# Patient Record
Sex: Male | Born: 1980 | Race: White | Hispanic: Yes | Marital: Single | Smoking: Never smoker
Health system: Southern US, Community
[De-identification: ages and names within clinical notes are randomized; demographics above are authoritative.]

## PROBLEM LIST (undated history)

## (undated) DIAGNOSIS — E119 Type 2 diabetes mellitus without complications: Secondary | ICD-10-CM

## (undated) HISTORY — DX: Type 2 diabetes mellitus without complications: E11.9

---

## 2004-05-04 ENCOUNTER — Emergency Department (HOSPITAL_COMMUNITY): Admission: EM | Admit: 2004-05-04 | Discharge: 2004-05-04 | Payer: Self-pay | Admitting: Emergency Medicine

## 2005-05-03 ENCOUNTER — Ambulatory Visit (HOSPITAL_COMMUNITY): Admission: RE | Admit: 2005-05-03 | Discharge: 2005-05-03 | Payer: Self-pay | Admitting: Emergency Medicine

## 2005-05-03 ENCOUNTER — Emergency Department (HOSPITAL_COMMUNITY): Admission: EM | Admit: 2005-05-03 | Discharge: 2005-05-03 | Payer: Self-pay | Admitting: Family Medicine

## 2007-07-13 ENCOUNTER — Emergency Department (HOSPITAL_COMMUNITY): Admission: EM | Admit: 2007-07-13 | Discharge: 2007-07-13 | Payer: Self-pay | Admitting: Emergency Medicine
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
## 2013-10-27 ENCOUNTER — Ambulatory Visit: Payer: No Typology Code available for payment source | Attending: Internal Medicine

## 2013-12-21 ENCOUNTER — Ambulatory Visit: Payer: No Typology Code available for payment source | Attending: Internal Medicine | Admitting: Internal Medicine

## 2013-12-21 ENCOUNTER — Encounter: Payer: Self-pay | Admitting: Internal Medicine

## 2013-12-21 VITALS — BP 119/80 | HR 75 | Temp 98.0°F | Resp 16 | Wt 203.0 lb

## 2013-12-21 DIAGNOSIS — E785 Hyperlipidemia, unspecified: Secondary | ICD-10-CM

## 2013-12-21 DIAGNOSIS — Z91199 Patient's noncompliance with other medical treatment and regimen due to unspecified reason: Secondary | ICD-10-CM | POA: Insufficient documentation

## 2013-12-21 DIAGNOSIS — Z9119 Patient's noncompliance with other medical treatment and regimen: Secondary | ICD-10-CM | POA: Insufficient documentation

## 2013-12-21 DIAGNOSIS — Z139 Encounter for screening, unspecified: Secondary | ICD-10-CM

## 2013-12-21 DIAGNOSIS — E119 Type 2 diabetes mellitus without complications: Secondary | ICD-10-CM | POA: Insufficient documentation

## 2013-12-21 DIAGNOSIS — Z833 Family history of diabetes mellitus: Secondary | ICD-10-CM | POA: Insufficient documentation

## 2013-12-21 DIAGNOSIS — Z130 Encounter for screening for diseases of the blood and blood-forming organs and certain disorders involving the immune mechanism: Secondary | ICD-10-CM | POA: Insufficient documentation

## 2013-12-21 LAB — COMPLETE METABOLIC PANEL WITH GFR
ALT: 33 U/L (ref 0–53)
AST: 17 U/L (ref 0–37)
Albumin: 4.4 g/dL (ref 3.5–5.2)
Alkaline Phosphatase: 88 U/L (ref 39–117)
BUN: 14 mg/dL (ref 6–23)
CO2: 26 mEq/L (ref 19–32)
Calcium: 9.2 mg/dL (ref 8.4–10.5)
Chloride: 99 mEq/L (ref 96–112)
Creat: 0.64 mg/dL (ref 0.50–1.35)
GFR, Est African American: >89 mL/min
GFR, Est Non African American: >89 mL/min
Glucose, Bld: 290 mg/dL — ABNORMAL HIGH (ref 70–99)
Potassium: 4.3 mEq/L (ref 3.5–5.3)
Sodium: 136 mEq/L (ref 135–145)
Total Bilirubin: 0.8 mg/dL (ref 0.2–1.2)
Total Protein: 7 g/dL (ref 6.0–8.3)

## 2013-12-21 LAB — CBC WITH DIFFERENTIAL/PLATELET
Basophils Absolute: 0 10*3/uL (ref 0.0–0.1)
Basophils Relative: 0 % (ref 0–1)
Eosinophils Absolute: 0.1 10*3/uL (ref 0.0–0.7)
Eosinophils Relative: 2 % (ref 0–5)
HCT: 45.4 % (ref 39.0–52.0)
Hemoglobin: 15 g/dL (ref 13.0–17.0)
Lymphocytes Relative: 37 % (ref 12–46)
Lymphs Abs: 2.1 10*3/uL (ref 0.7–4.0)
MCH: 25.6 pg — ABNORMAL LOW (ref 26.0–34.0)
MCHC: 33 g/dL (ref 30.0–36.0)
MCV: 77.5 fL — ABNORMAL LOW (ref 78.0–100.0)
Monocytes Absolute: 0.3 10*3/uL (ref 0.1–1.0)
Monocytes Relative: 6 % (ref 3–12)
Neutro Abs: 3.1 10*3/uL (ref 1.7–7.7)
Neutrophils Relative %: 55 % (ref 43–77)
Platelets: 267 10*3/uL (ref 150–400)
RBC: 5.86 MIL/uL — ABNORMAL HIGH (ref 4.22–5.81)
RDW: 13.6 % (ref 11.5–15.5)
WBC: 5.6 10*3/uL (ref 4.0–10.5)

## 2013-12-21 LAB — LIPID PANEL
Cholesterol: 251 mg/dL — ABNORMAL HIGH (ref 0–200)
HDL: 42 mg/dL (ref >39)
LDL Cholesterol: 140 mg/dL — ABNORMAL HIGH (ref 0–99)
Total CHOL/HDL Ratio: 6 Ratio
Triglycerides: 346 mg/dL — ABNORMAL HIGH (ref <150)
VLDL: 69 mg/dL — ABNORMAL HIGH (ref 0–40)

## 2013-12-21 LAB — POCT GLYCOSYLATED HEMOGLOBIN (HGB A1C): Hemoglobin A1C: 12.9

## 2013-12-21 LAB — GLUCOSE, POCT (MANUAL RESULT ENTRY): POC Glucose: 280 mg/dl — AB (ref 70–99)

## 2013-12-21 LAB — TSH: TSH: 0.726 u[IU]/mL (ref 0.350–4.500)

## 2013-12-21 MED ORDER — METFORMIN HCL 500 MG PO TABS: 500.0000 mg | ORAL_TABLET | Freq: Two times a day (BID) | ORAL | Status: DC

## 2013-12-21 MED ORDER — INSULIN ASPART 100 UNIT/ML ~~LOC~~ SOLN
10.0000 [IU] | Freq: Once | SUBCUTANEOUS | Status: AC
  Administered 2013-12-21: 10 [IU] via SUBCUTANEOUS

## 2013-12-21 MED ORDER — PRAVASTATIN SODIUM 40 MG PO TABS: 40.0000 mg | ORAL_TABLET | Freq: Every day | ORAL | Status: DC

## 2013-12-21 MED ORDER — GLIMEPIRIDE 1 MG PO TABS: 1.0000 mg | ORAL_TABLET | Freq: Every day | ORAL | Status: DC

## 2013-12-21 NOTE — Progress Notes (Signed)
Patient Demographics  Arthur Griffith, is a 33 y.o. male  OMA:[id number]  FSE:[id number]  DOB - 10/28/1980  CC:  Chief Complaint  Patient presents with  . Establish Care       HPI: Arthur Griffith is a 33 y.o. male here today to establish medical care. Patient has history of diabetes, hyperlipidemia as per patient he was taking metformin 500 mg 3 times a day and Amaryl 1 mg daily as per patient his fasting sugar used to be around 120 to 130 milligrams a deciliter, for the last 2 months patient has not been taking his medication, denies any headache dizziness blurry vision polyuria polydipsia, today his blood sugar was 2 80 mg/dL, was given 10 units of insulin.  Patient has No headache, No chest pain, No abdominal pain - No Nausea, No new weakness tingling or numbness, No Cough - SOB.  No Known Allergies Past Medical History  Diagnosis Date  . Diabetes mellitus without complication    No current outpatient prescriptions on file prior to visit.   No current facility-administered medications on file prior to visit.   Family History  Problem Relation Age of Onset  . Diabetes Mother   . Diabetes Father   . Diabetes Maternal Grandmother   . Diabetes Maternal Grandfather   . Diabetes Paternal Grandmother   . Diabetes Paternal Grandfather    History   Social History  . Marital Status: Single    Spouse Name: N/A    Number of Children: N/A  . Years of Education: N/A   Occupational History  . Not on file.   Social History Main Topics  . Smoking status: Never Smoker   . Smokeless tobacco: Not on file  . Alcohol Use: Yes  . Drug Use: Not on file  . Sexual Activity: Not on file   Other Topics Concern  . Not on file   Social History Narrative  . No narrative on file    Review of Systems: Constitutional: Negative for fever, chills, diaphoresis, activity change, appetite change and fatigue. HENT: Negative for ear pain, nosebleeds, congestion, facial swelling,  rhinorrhea, neck pain, neck stiffness and ear discharge.  Eyes: Negative for pain, discharge, redness, itching and visual disturbance. Respiratory: Negative for cough, choking, chest tightness, shortness of breath, wheezing and stridor.  Cardiovascular: Negative for chest pain, palpitations and leg swelling. Gastrointestinal: Negative for abdominal distention. Genitourinary: Negative for dysuria, urgency, frequency, hematuria, flank pain, decreased urine volume, difficulty urinating and dyspareunia.  Musculoskeletal: Negative for back pain, joint swelling, arthralgia and gait problem. Neurological: Negative for dizziness, tremors, seizures, syncope, facial asymmetry, speech difficulty, weakness, light-headedness, numbness and headaches.  Hematological: Negative for adenopathy. Does not bruise/bleed easily. Psychiatric/Behavioral: Negative for hallucinations, behavioral problems, confusion, dysphoric mood, decreased concentration and agitation.    Objective:   Filed Vitals:   12/21/13 0942  BP: 119/80  Pulse: 75  Temp: 98 F (36.7 C)  Resp: 16    Physical Exam: Constitutional: Patient appears well-developed and well-nourished. No distress. HENT: Normocephalic, atraumatic, External right and left ear normal. Oropharynx is clear and moist.  Eyes: Conjunctivae and EOM are normal. PERRLA, no scleral icterus. Neck: Normal ROM. Neck supple. No JVD. No tracheal deviation. No thyromegaly. CVS: RRR, S1/S2 +, no murmurs, no gallops, no carotid bruit.  Pulmonary: Effort and breath sounds normal, no stridor, rhonchi, wheezes, rales.  Abdominal: Soft. BS +, no distension, tenderness, rebound or guarding.  Musculoskeletal: Normal range of motion. No edema and no tenderness.  Neuro:  Alert. Normal reflexes, muscle tone coordination. No cranial nerve deficit. Skin: Skin is warm and dry. No rash noted. Not diaphoretic. No erythema. No pallor. Psychiatric: Normal mood and affect. Behavior, judgment,  thought content normal.  No results found for this basename: WBC, HGB, HCT, MCV, PLT   No results found for this basename: CREATININE, BUN, NA, K, CL, CO2    Lab Results  Component Value Date   HGBA1C 12.9 12/21/2013   Lipid Panel  No results found for this basename: chol, trig, hdl, cholhdl, vldl, ldlcalc       Assessment and plan:   1. DM (diabetes mellitus) Results for orders placed in visit on 12/21/13  GLUCOSE, POCT (MANUAL RESULT ENTRY)      Result Value Ref Range   POC Glucose 280 (*) 70 - 99 mg/dl  POCT GLYCOSYLATED HEMOGLOBIN (HGB A1C)      Result Value Ref Range   Hemoglobin A1C 12.9     Uncontrolled, patient has not been taking the medication for the last 2 months, I have resumed back on metformin and Amaryl, advise patient for diabetes meal planning also keep the fingerstick log. The repeat A1c in 3 months  - insulin aspart (novoLOG) injection 10 Units; Inject 0.1 mLs (10 Units total) into the skin once. - metFORMIN (GLUCOPHAGE) 500 MG tablet; Take 1 tablet (500 mg total) by mouth 2 (two) times daily with a meal.  Dispense: 60 tablet; Refill: 3 - glimepiride (AMARYL) 1 MG tablet; Take 1 tablet (1 mg total) by mouth daily with breakfast.  Dispense: 30 tablet; Refill: 2  2. Other and unspecified hyperlipidemia Resume back on Pravachol, will check lipid panel. - pravastatin (PRAVACHOL) 40 MG tablet; Take 1 tablet (40 mg total) by mouth daily.  Dispense: 30 tablet; Refill: 2  3. Screening Baseline blood work - CBC with Differential - COMPLETE METABOLIC PANEL WITH GFR - TSH - Lipid panel - Vit D  25 hydroxy (rtn osteoporosis monitoring)   Return in about 3 months (around 03/23/2014) for diabetes, hyperipidemia.   Doris Cheadleeepak Sidonia Nutter, MD

## 2013-12-21 NOTE — Progress Notes (Signed)
Patient here to establish care Has history of DM Has not had any of his medications in over a month

## 2013-12-21 NOTE — Patient Instructions (Signed)

## 2013-12-22 ENCOUNTER — Telehealth: Payer: Self-pay

## 2013-12-22 DIAGNOSIS — E785 Hyperlipidemia, unspecified: Secondary | ICD-10-CM

## 2013-12-22 LAB — VITAMIN D 25 HYDROXY (VIT D DEFICIENCY, FRACTURES): Vit D, 25-Hydroxy: 37 ng/mL (ref 30–89)

## 2013-12-22 MED ORDER — FENOFIBRATE 145 MG PO TABS: 145.0000 mg | ORAL_TABLET | Freq: Every day | ORAL | Status: DC

## 2013-12-22 NOTE — Telephone Encounter (Signed)
Placed call to patient's home and mobile number. Home number invalid and mobile number not in service. Call placed using [employer]pre[employer] service.

## 2013-12-22 NOTE — Telephone Encounter (Signed)
Fenofibrate 145 mg daily ordered and sent to pharmacy on file.

## 2013-12-22 NOTE — Telephone Encounter (Signed)
Message copied by Marykay Lex on Wed Dec 22, 2013  3:57 PM ------      Message from: Doris Cheadle      Created: Wed Dec 22, 2013 10:01 AM       Blood work reviewed, noticed elevated triglycerides, advise patient for low fat diet. And start taking fenofibrate 145 mg daily.      Advise patient to take his diabetes medications regularly.       ------

## 2014-03-22 ENCOUNTER — Emergency Department (HOSPITAL_COMMUNITY): Payer: No Typology Code available for payment source

## 2014-03-22 ENCOUNTER — Encounter (HOSPITAL_COMMUNITY): Payer: Self-pay | Admitting: Emergency Medicine

## 2014-03-22 ENCOUNTER — Emergency Department (HOSPITAL_COMMUNITY)
Admission: EM | Admit: 2014-03-22 | Discharge: 2014-03-22 | Disposition: A | Discharge status: Home / Self Care | Payer: No Typology Code available for payment source | Attending: Emergency Medicine | Admitting: Emergency Medicine

## 2014-03-22 DIAGNOSIS — E119 Type 2 diabetes mellitus without complications: Secondary | ICD-10-CM | POA: Insufficient documentation

## 2014-03-22 DIAGNOSIS — R079 Chest pain, unspecified: Secondary | ICD-10-CM | POA: Insufficient documentation

## 2014-03-22 DIAGNOSIS — Z79899 Other long term (current) drug therapy: Secondary | ICD-10-CM | POA: Insufficient documentation

## 2014-03-22 DIAGNOSIS — R42 Dizziness and giddiness: Secondary | ICD-10-CM | POA: Insufficient documentation

## 2014-03-22 DIAGNOSIS — R0602 Shortness of breath: Secondary | ICD-10-CM | POA: Insufficient documentation

## 2014-03-22 DIAGNOSIS — R0789 Other chest pain: Secondary | ICD-10-CM

## 2014-03-22 LAB — HEPATIC FUNCTION PANEL
ALK PHOS: 95 U/L (ref 39–117)
ALT: 42 U/L (ref 0–53)
AST: 32 U/L (ref 0–37)
Albumin: 3.9 g/dL (ref 3.5–5.2)
BILIRUBIN TOTAL: 0.6 mg/dL (ref 0.3–1.2)
Bilirubin, Direct: <0.2 mg/dL (ref 0.0–0.3)
TOTAL PROTEIN: 7.4 g/dL (ref 6.0–8.3)

## 2014-03-22 LAB — CBC
HCT: 45.1 % (ref 39.0–52.0)
Hemoglobin: 14.9 g/dL (ref 13.0–17.0)
MCH: 26.1 pg (ref 26.0–34.0)
MCHC: 33 g/dL (ref 30.0–36.0)
MCV: 79 fL (ref 78.0–100.0)
Platelets: 251 10*3/uL (ref 150–400)
RBC: 5.71 MIL/uL (ref 4.22–5.81)
RDW: 13.2 % (ref 11.5–15.5)
WBC: 5.3 10*3/uL (ref 4.0–10.5)

## 2014-03-22 LAB — BASIC METABOLIC PANEL
Anion gap: 14 (ref 5–15)
BUN: 11 mg/dL (ref 6–23)
CO2: 23 mEq/L (ref 19–32)
Calcium: 8.9 mg/dL (ref 8.4–10.5)
Chloride: 99 mEq/L (ref 96–112)
Creatinine, Ser: 0.47 mg/dL — ABNORMAL LOW (ref 0.50–1.35)
GFR calc Af Amer: >90 mL/min (ref >90)
GFR calc non Af Amer: >90 mL/min (ref >90)
Glucose, Bld: 289 mg/dL — ABNORMAL HIGH (ref 70–99)
Potassium: 4.1 mEq/L (ref 3.7–5.3)
Sodium: 136 mEq/L — ABNORMAL LOW (ref 137–147)

## 2014-03-22 LAB — LIPASE, BLOOD: Lipase: 28 U/L (ref 11–59)

## 2014-03-22 LAB — TROPONIN I

## 2014-03-22 LAB — PRO B NATRIURETIC PEPTIDE: Pro B Natriuretic peptide (BNP): <5 pg/mL (ref 0–125)

## 2014-03-22 MED ORDER — NAPROXEN 500 MG PO TABS: 500.0000 mg | ORAL_TABLET | Freq: Two times a day (BID) | ORAL | Status: DC

## 2014-03-22 MED ORDER — KETOROLAC TROMETHAMINE 30 MG/ML IJ SOLN
30.0000 mg | Freq: Once | INTRAMUSCULAR | Status: AC
  Administered 2014-03-22: 30 mg via INTRAVENOUS
  Filled 2014-03-22: qty 1

## 2014-03-22 MED ORDER — ASPIRIN 81 MG PO CHEW
324.0000 mg | CHEWABLE_TABLET | Freq: Once | ORAL | Status: AC
  Administered 2014-03-22: 324 mg via ORAL
  Filled 2014-03-22: qty 4

## 2014-03-22 MED ORDER — SODIUM CHLORIDE 0.9 % IV BOLUS (SEPSIS)
1000.0000 mL | Freq: Once | INTRAVENOUS | Status: AC
  Administered 2014-03-22: 1000 mL via INTRAVENOUS

## 2014-03-22 MED ORDER — GI COCKTAIL ~~LOC~~
30.0000 mL | Freq: Once | ORAL | Status: AC
  Administered 2014-03-22: 30 mL via ORAL
  Filled 2014-03-22: qty 30

## 2014-03-22 NOTE — Discharge Instructions (Signed)
Please follow up with your primary care physician in 1-2 days. If you do not have one please call the [facility] number listed above. Please read all discharge instructions and return precautions.    Dolor de pecho (no especfico) (Chest Pain (Nonspecific)) Con frecuencia es difcil dar un diagnstico especfico de la causa del dolor de [place]. Siempre hay una posibilidad de que el dolor podra estar relacionado con algo grave, como un ataque al corazn o un cogulo sanguneo en los pulmones. Debe someterse a controles con el mdico para ms evaluaciones. CAUSAS   Acidez.  Neumona o bronquitis.  Ansiedad o estrs.  Inflamacin de la zona que rodea al corazn (pericarditis) o a los pulmones (pleuritis o pleuresa).  Un cogulo sanguneo en el pulmn.  Colapso de un pulmn (neumotrax), que puede aparecer de [employer] repentina por s solo (neumotrax espontneo) o debido a un traumatismo en el trax.  Culebrilla (virus del herpes zster). La pared torcica est compuesta por huesos, msculos y TEFL teacher. Cualquiera de estos puede ser la fuente del dolor.  Puede haber una contusin en los huesos debido a una lesin.  Puede haber un esguince en los msculos o el cartlago ocasionado por la tos o por Rea.  El cartlago puede verse afectado por una inflamacin y Psychologist, counselling (costocondritis). DIAGNSTICO  Gretchen Short se necesiten anlisis de laboratorio u otros estudios para Veterinary surgeon causa del Engineer, mining. Adems, puede indicarle que se haga una prueba llamada electrocadiograma (ECG) ambulatorio. El ECG registra los patrones de los latidos cardacos durante 24horas. Adems, pueden hacerle otros estudios, por ejemplo:  Ecocardiograma transtorcico (ETT). Durante Management consultant, se usan ondas sonoras para evaluar el flujo de la sangre a travs del corazn.  Ecocardiograma transesofgico (ETE).  Monitoreo cardaco. Permite que el mdico controle la frecuencia  y el ritmo cardaco en tiempo real.  Monitor Holter. Es un dispositivo porttil que [employer] latidos cardacos y [place] a Education administrator las arritmias cardacas. Le permite al [employer] registrar la actividad cardaca durante varios das, si es necesario.  Pruebas de estrs por ejercicio o por medicamentos que aceleran los latidos cardacos. TRATAMIENTO   El tratamiento depende de la causa del dolor de [place]. El tratamiento puede incluir:  Inhibidores de la acidez estomacal.  Antiinflamatorios.  Analgsicos para las enfermedades inflamatorias.  Antibiticos, si hay una infeccin.  Podrn aconsejarle que modifique su estilo de vida. Esto incluye dejar de fumar y evitar el alcohol, la cafena y el chocolate.  Pueden aconsejarle que mantenga la cabeza levantada (elevada) cuando duerme. Esto reduce la probabilidad de que el cido retroceda del estmago al esfago. En la [employer], el dolor de pecho no especfico mejorar en el trmino de 2 a 3das, con reposo y [employer].  INSTRUCCIONES PARA EL CUIDADO EN EL HOGAR   Si le prescriben antibiticos, tmelos tal como se le indic. Termnelos aunque comience a sentirse mejor.  [address], no haga actividades fsicas que provoquen dolor de [place]. Contine con las actividades fsicas tal como se le indic  No consuma ningn producto que contenga tabaco, incluidos cigarrillos, tabaco de Theatre manager o cigarrillos electrnicos.  Evite el consumo de alcohol.  Tome los medicamentos solamente como se lo haya indicado el mdico.  Siga las sugerencias del mdico en lo que respecta a las pruebas adicionales, si el dolor de pecho no desaparece.  Concurra a todas las visitas de control programadas. Si no lo hace, podra desarrollar problemas permanentes (crnicos) relacionados con  el dolor. Si hay algn problema para concurrir a una cita, llame para reprogramarla. SOLICITE ATENCIN MDICA SI:   El dolor de pecho no  desaparece, incluso despus del tratamiento.  Tiene una erupcin cutnea con ampollas en el pecho.  Tiene fiebre. SOLICITE ATENCIN MDICA DE [employer] SI:   Aumenta el dolor de pecho o este se irradia hacia el brazo, el cuello, la [place], la espalda o el abdomen.  Le falta el aire.  La tos empeora, o expectora sangre.  Siente dolor intenso en la espalda o el abdomen.  Se siente nauseoso o vomita.  Siente debilidad intensa.  Se desmaya.  Tiene escalofros. Esto es Radio broadcast assistant. No espere a ver si el dolor se pasa. Obtenga ayuda mdica de inmediato. Llame a los servicios de emergencia locales (911 en los [place]). No conduzca por sus propios medios [employer] hospital. ASEGRESE DE QUE:   Comprende estas instrucciones.  Controlar su afeccin.  Recibir ayuda de inmediato si no mejora o si empeora. Document Released: 07/15/2005 Document Revised: 07/20/2013 [facility] Patient Information 2015 [place], [place]. This information is not intended to replace advice given to you by your health care provider. Make sure you discuss any questions you have with your health care provider.

## 2014-03-22 NOTE — ED Notes (Signed)
PA at bedside.

## 2014-03-22 NOTE — ED Provider Notes (Signed)
Medical screening examination/treatment/procedure(s) were performed by non-physician practitioner and as supervising physician I was immediately available for consultation/collaboration.     Geoffery Lyons, MD 03/22/14 [phone]

## 2014-03-22 NOTE — ED Provider Notes (Signed)
CSN: [id number]     Arrival date & time 03/22/14  0914 History   First MD Initiated Contact with Patient 03/22/14 [phone]     Chief Complaint  Patient presents with  . Chest Pain  . Shortness of Breath     (Consider location/radiation/quality/duration/timing/severity/associated sxs/prior Treatment) HPI Comments: Patient is a 33 year old male past medical history significant for DM, HLD presenting to the emergency department for 2 weeks of central chest pain associated shortness of breath. Pain has been constant. No alleviating or aggravating factors. No medications taken PTA. No history of similar chest pain. Denies any associated fevers, chills, nausea, vomiting, abdominal pain, leg swelling. PERC negative. No familial early cardiac history.   Patient is a 33 y.o. male presenting with chest pain and shortness of breath.  Chest Pain Associated symptoms: shortness of breath   Associated symptoms: no fever and no headache   Shortness of Breath Associated symptoms: chest pain   Associated symptoms: no fever and no headaches     Past Medical History  Diagnosis Date  . Diabetes mellitus without complication    History reviewed. No pertinent past surgical history. Family History  Problem Relation Age of Onset  . Diabetes Mother   . Diabetes Father   . Diabetes Maternal Grandmother   . Diabetes Maternal Grandfather   . Diabetes Paternal Grandmother   . Diabetes Paternal Grandfather    History  Substance Use Topics  . Smoking status: Never Smoker   . Smokeless tobacco: Not on file  . Alcohol Use: Yes    Review of Systems  Constitutional: Negative for fever and chills.  Respiratory: Positive for shortness of breath.   Cardiovascular: Positive for chest pain.  Neurological: Positive for light-headedness. Negative for syncope and headaches.  All other systems reviewed and are negative.     Allergies  Review of patient's allergies indicates no known allergies.  Home  Medications   Prior to Admission medications   Medication Sig Start Date End Date Taking? Authorizing Provider  glimepiride (AMARYL) 1 MG tablet Take 1 tablet (1 mg total) by mouth daily with breakfast. 12/21/13  Yes Doris Cheadle, MD  metFORMIN (GLUCOPHAGE) 500 MG tablet Take 1 tablet (500 mg total) by mouth 2 (two) times daily with a meal. 12/21/13  Yes Deepak Advani, MD  pravastatin (PRAVACHOL) 40 MG tablet Take 1 tablet (40 mg total) by mouth daily. 12/21/13  Yes Doris Cheadle, MD  naproxen (NAPROSYN) 500 MG tablet Take 1 tablet (500 mg total) by mouth 2 (two) times daily with a meal. 03/22/14   Freddye Cardamone L Zander Ingham, PA-C   BP 101/61  Pulse 62  Temp(Src) 98.1 F (36.7 C) (Oral)  Resp 14  SpO2 98% Physical Exam  Nursing note and vitals reviewed. Constitutional: He is oriented to person, place, and time. He appears well-developed and well-nourished. No distress.  HENT:  Head: Normocephalic and atraumatic.  Right Ear: External ear normal.  Left Ear: External ear normal.  Nose: Nose normal.  Mouth/Throat: Oropharynx is clear and moist. No oropharyngeal exudate.  Eyes: Conjunctivae and EOM are normal. Pupils are equal, round, and reactive to light.  Neck: Normal range of motion. Neck supple.  Cardiovascular: Normal rate, regular rhythm, normal heart sounds and intact distal pulses.   Pulmonary/Chest: Effort normal and breath sounds normal. No respiratory distress.  Abdominal: Soft. There is no tenderness.  Musculoskeletal: He exhibits no edema.  Neurological: He is alert and oriented to person, place, and time. He has normal strength. No cranial nerve deficit.  Gait normal. GCS eye subscore is 4. GCS verbal subscore is 5. GCS motor subscore is 6.  Sensation grossly intact.  No pronator drift.  Bilateral heel-knee-shin intact.  Skin: Skin is warm and dry. He is not diaphoretic.    ED Course  Procedures (including critical care time) Medications  aspirin chewable tablet 324 mg (324  mg Oral Given 03/22/14 1056)  gi cocktail (Maalox,Lidocaine,Donnatal) (30 mLs Oral Given 03/22/14 1056)  sodium chloride 0.9 % bolus 1,000 mL (0 mLs Intravenous Stopped 03/22/14 1242)  ketorolac (TORADOL) 30 MG/ML injection 30 mg (30 mg Intravenous Given 03/22/14 1148)    Labs Review Labs Reviewed  BASIC METABOLIC PANEL - Abnormal; Notable for the following:    Sodium 136 (*)    Glucose, Bld 289 (*)    Creatinine, Ser 0.47 (*)    All other components within normal limits  CBC  PRO B NATRIURETIC PEPTIDE  TROPONIN I  HEPATIC FUNCTION PANEL  LIPASE, BLOOD  I-STAT TROPOININ, ED    Imaging Review Dg Chest 2 View  03/22/2014   CLINICAL DATA:  Chest pain, shortness of breath  EXAM: CHEST  2 VIEW  COMPARISON:  None.  FINDINGS: Lungs are clear.  No pleural effusion or pneumothorax.  The heart is normal in size.  Visualized osseous structures are within normal limits.  IMPRESSION: No evidence of acute cardiopulmonary disease.   Electronically Signed   By: Charline Bills M.D.   On: 03/22/2014 10:03     EKG Interpretation None      MDM   Final diagnoses:  Chest pain, atypical    Filed Vitals:   03/22/14 1230  BP: 101/61  Pulse: 62  Temp:   Resp: 14   Afebrile, NAD, non-toxic appearing, AAOx4.  Patient is to be discharged with recommendation to follow up with PCP in regards to today's hospital visit. Chest pain is not likely of cardiac or pulmonary etiology d/t presentation, perc negative, VSS, no tracheal deviation, no JVD or new murmur, RRR, breath sounds equal bilaterally, EKG without acute abnormalities, negative troponin, and negative CXR. Pt has been advised to return to the ED is CP becomes exertional, associated with diaphoresis or nausea, radiates to left jaw/arm, worsens or becomes concerning in any way. Pt appears reliable for follow up and is agreeable to discharge.   Case has been discussed with Dr. Judd Lien who agrees with the above plan to discharge.      Jeannetta Ellis, PA-C 03/22/14 1253

## 2014-03-22 NOTE — ED Notes (Signed)
Pt reports mid chest pains and tightness x 2 weeks with sob. ekg done at triage and airway intact.

## 2014-04-11 ENCOUNTER — Encounter: Payer: Self-pay | Admitting: Internal Medicine

## 2014-04-11 ENCOUNTER — Ambulatory Visit: Payer: No Typology Code available for payment source | Attending: Internal Medicine | Admitting: Internal Medicine

## 2014-04-11 VITALS — BP 121/80 | HR 98 | Temp 98.2°F | Resp 16 | Wt 204.0 lb

## 2014-04-11 DIAGNOSIS — E139 Other specified diabetes mellitus without complications: Secondary | ICD-10-CM

## 2014-04-11 DIAGNOSIS — E781 Pure hyperglyceridemia: Secondary | ICD-10-CM

## 2014-04-11 DIAGNOSIS — E119 Type 2 diabetes mellitus without complications: Secondary | ICD-10-CM | POA: Insufficient documentation

## 2014-04-11 DIAGNOSIS — Z791 Long term (current) use of non-steroidal anti-inflammatories (NSAID): Secondary | ICD-10-CM | POA: Insufficient documentation

## 2014-04-11 DIAGNOSIS — E089 Diabetes mellitus due to underlying condition without complications: Secondary | ICD-10-CM

## 2014-04-11 DIAGNOSIS — E785 Hyperlipidemia, unspecified: Secondary | ICD-10-CM

## 2014-04-11 DIAGNOSIS — Z23 Encounter for immunization: Secondary | ICD-10-CM

## 2014-04-11 LAB — POCT GLYCOSYLATED HEMOGLOBIN (HGB A1C): Hemoglobin A1C: 12

## 2014-04-11 LAB — GLUCOSE, POCT (MANUAL RESULT ENTRY): POC Glucose: 347 mg/dl — AB (ref 70–99)

## 2014-04-11 MED ORDER — METFORMIN HCL 1000 MG PO TABS: 500.0000 mg | ORAL_TABLET | Freq: Two times a day (BID) | ORAL | Status: AC

## 2014-04-11 MED ORDER — GLIMEPIRIDE 2 MG PO TABS: 1.0000 mg | ORAL_TABLET | Freq: Every day | ORAL | Status: AC

## 2014-04-11 MED ORDER — NAPROXEN 500 MG PO TABS: 500.0000 mg | ORAL_TABLET | Freq: Two times a day (BID) | ORAL | Status: DC

## 2014-04-11 MED ORDER — GEMFIBROZIL 600 MG PO TABS: 600.0000 mg | ORAL_TABLET | Freq: Two times a day (BID) | ORAL | Status: AC

## 2014-04-11 MED ORDER — PRAVASTATIN SODIUM 40 MG PO TABS: 40.0000 mg | ORAL_TABLET | Freq: Every day | ORAL | Status: AC

## 2014-04-11 NOTE — Progress Notes (Signed)
Patient here for follow up on his DM and in need of medication refills Patient blood sugar slightly elevated  Patient stated he had lunch about two hours ago

## 2014-04-11 NOTE — Patient Instructions (Signed)
Fat and Cholesterol Control Diet Fat and cholesterol levels in your blood and organs are influenced by your diet. High levels of fat and cholesterol may lead to diseases of the heart, small and large blood vessels, gallbladder, liver, and pancreas. CONTROLLING FAT AND CHOLESTEROL WITH DIET Although exercise and lifestyle factors are important, your diet is key. That is because certain foods are known to raise cholesterol and others to lower it. The goal is to balance foods for their effect on cholesterol and more importantly, to replace saturated and trans fat with other types of fat, such as monounsaturated fat, polyunsaturated fat, and omega-3 fatty acids. On average, a person should consume no more than 15 to 17 g of saturated fat daily. Saturated and trans fats are considered "bad" fats, and they will raise LDL cholesterol. Saturated fats are primarily found in animal products such as meats, butter, and cream. However, that does not mean you need to give up all your favorite foods. Today, there are good tasting, low-fat, low-cholesterol substitutes for most of the things you like to eat. Choose low-fat or nonfat alternatives. Choose round or loin cuts of red meat. These types of cuts are lowest in fat and cholesterol. Chicken (without the skin), fish, veal, and ground turkey breast are great choices. Eliminate fatty meats, such as hot dogs and salami. Even shellfish have little or no saturated fat. Have a 3 oz (85 g) portion when you eat lean meat, poultry, or fish. Trans fats are also called "partially hydrogenated oils." They are oils that have been scientifically manipulated so that they are solid at room temperature resulting in a longer shelf life and improved taste and texture of foods in which they are added. Trans fats are found in stick margarine, some tub margarines, cookies, crackers, and baked goods.  When baking and cooking, oils are a great substitute for butter. The monounsaturated oils are  especially beneficial since it is believed they lower LDL and raise HDL. The oils you should avoid entirely are saturated tropical oils, such as coconut and palm.  Remember to eat a lot from food groups that are naturally free of saturated and trans fat, including fish, fruit, vegetables, beans, grains (barley, rice, couscous, bulgur wheat), and pasta (without cream sauces).  IDENTIFYING FOODS THAT LOWER FAT AND CHOLESTEROL  Soluble fiber may lower your cholesterol. This type of fiber is found in fruits such as apples, vegetables such as broccoli, potatoes, and carrots, legumes such as beans, peas, and lentils, and grains such as barley. Foods fortified with plant sterols (phytosterol) may also lower cholesterol. You should eat at least 2 g per day of these foods for a cholesterol lowering effect.  Read package labels to identify low-saturated fats, trans fat free, and low-fat foods at the supermarket. Select cheeses that have only 2 to 3 g saturated fat per ounce. Use a heart-healthy tub margarine that is free of trans fats or partially hydrogenated oil. When buying baked goods (cookies, crackers), avoid partially hydrogenated oils. Breads and muffins should be made from whole grains (whole-wheat or whole oat flour, instead of "flour" or "enriched flour"). Buy non-creamy canned soups with reduced salt and no added fats.  FOOD PREPARATION TECHNIQUES  Never deep-fry. If you must fry, either stir-fry, which uses very little fat, or use non-stick cooking sprays. When possible, broil, bake, or roast meats, and steam vegetables. Instead of putting butter or margarine on vegetables, use lemon and herbs, applesauce, and cinnamon (for squash and sweet potatoes). Use nonfat   yogurt, salsa, and low-fat dressings for salads.  LOW-SATURATED FAT / LOW-FAT FOOD SUBSTITUTES Meats / Saturated Fat (g)  Avoid: Steak, marbled (3 oz/85 g) / 11 g  Choose: Steak, lean (3 oz/85 g) / 4 g  Avoid: Hamburger (3 oz/85 g) / 7  g  Choose: Hamburger, lean (3 oz/85 g) / 5 g  Avoid: Ham (3 oz/85 g) / 6 g  Choose: Ham, lean cut (3 oz/85 g) / 2.4 g  Avoid: Chicken, with skin, dark meat (3 oz/85 g) / 4 g  Choose: Chicken, skin removed, dark meat (3 oz/85 g) / 2 g  Avoid: Chicken, with skin, light meat (3 oz/85 g) / 2.5 g  Choose: Chicken, skin removed, light meat (3 oz/85 g) / 1 g Dairy / Saturated Fat (g)  Avoid: Whole milk (1 cup) / 5 g  Choose: Low-fat milk, 2% (1 cup) / 3 g  Choose: Low-fat milk, 1% (1 cup) / 1.5 g  Choose: Skim milk (1 cup) / 0.3 g  Avoid: Hard cheese (1 oz/28 g) / 6 g  Choose: Skim milk cheese (1 oz/28 g) / 2 to 3 g  Avoid: Cottage cheese, 4% fat (1 cup) / 6.5 g  Choose: Low-fat cottage cheese, 1% fat (1 cup) / 1.5 g  Avoid: Ice cream (1 cup) / 9 g  Choose: Sherbet (1 cup) / 2.5 g  Choose: Nonfat frozen yogurt (1 cup) / 0.3 g  Choose: Frozen fruit bar / trace  Avoid: Whipped cream (1 tbs) / 3.5 g  Choose: Nondairy whipped topping (1 tbs) / 1 g Condiments / Saturated Fat (g)  Avoid: Mayonnaise (1 tbs) / 2 g  Choose: Low-fat mayonnaise (1 tbs) / 1 g  Avoid: Butter (1 tbs) / 7 g  Choose: Extra light margarine (1 tbs) / 1 g  Avoid: Coconut oil (1 tbs) / 11.8 g  Choose: Olive oil (1 tbs) / 1.8 g  Choose: Corn oil (1 tbs) / 1.7 g  Choose: Safflower oil (1 tbs) / 1.2 g  Choose: Sunflower oil (1 tbs) / 1.4 g  Choose: Soybean oil (1 tbs) / 2.4 g  Choose: Canola oil (1 tbs) / 1 g Document Released: 07/15/2005 Document Revised: 11/09/2012 Document Reviewed: 10/13/2013 Exit[employer] Patient Information 2015 [employer]. This information is not intended to replace advice given to you by your health care provider. Make sure you discuss any questions you have with your health care provider. Diabetes Mellitus and Food It is important for you to manage your blood sugar (glucose) level. Your blood glucose level can be greatly affected by what you eat. Eating healthier  foods in the appropriate amounts throughout the day at about the same time each day will help you control your blood glucose level. It can also help slow or prevent worsening of your diabetes mellitus. Healthy eating may even help you improve the level of your blood pressure and reach or maintain a healthy weight.  HOW CAN FOOD AFFECT ME? Carbohydrates Carbohydrates affect your blood glucose level more than any other type of food. Your dietitian will help you determine how many carbohydrates to eat at each meal and teach you how to count carbohydrates. Counting carbohydrates is important to keep your blood glucose at a healthy level, especially if you are using insulin or taking certain medicines for diabetes mellitus. Alcohol Alcohol can cause sudden decreases in blood glucose (hypoglycemia), especially if you use insulin or take certain medicines for diabetes mellitus. Hypoglycemia can be a life-threatening   condition. Symptoms of hypoglycemia (sleepiness, dizziness, and disorientation) are similar to symptoms of having too much alcohol.  If your health care provider has given you approval to drink alcohol, do so in moderation and use the following guidelines:  Women should not have more than one drink per day, and men should not have more than two drinks per day. One drink is equal to:  12 oz of beer.  5 oz of wine.  1 oz of hard liquor.  Do not drink on an empty stomach.  Keep yourself hydrated. Have water, diet soda, or unsweetened iced tea.  Regular soda, juice, and other mixers might contain a lot of carbohydrates and should be counted. WHAT FOODS ARE NOT RECOMMENDED? As you make food choices, it is important to remember that all foods are not the same. Some foods have fewer nutrients per serving than other foods, even though they might have the same number of calories or carbohydrates. It is difficult to get your body what it needs when you eat foods with fewer nutrients. Examples of  foods that you should avoid that are high in calories and carbohydrates but low in nutrients include:  Trans fats (most processed foods list trans fats on the Nutrition Facts label).  Regular soda.  Juice.  Candy.  Sweets, such as cake, pie, doughnuts, and cookies.  Fried foods. WHAT FOODS CAN I EAT? Have nutrient-rich foods, which will nourish your body and keep you healthy. The food you should eat also will depend on several factors, including:  The calories you need.  The medicines you take.  Your weight.  Your blood glucose level.  Your blood pressure level.  Your cholesterol level. You also should eat a variety of foods, including:  Protein, such as meat, poultry, fish, tofu, nuts, and seeds (lean animal proteins are best).  Fruits.  Vegetables.  Dairy products, such as milk, cheese, and yogurt (low fat is best).  Breads, grains, pasta, cereal, rice, and beans.  Fats such as olive oil, trans fat-free margarine, canola oil, avocado, and olives. DOES EVERYONE WITH DIABETES MELLITUS HAVE THE SAME MEAL PLAN? Because every person with diabetes mellitus is different, there is not one meal plan that works for everyone. It is very important that you meet with a dietitian who will help you create a meal plan that is just right for you. Document Released: 04/11/2005 Document Revised: 07/20/2013 Document Reviewed: 06/11/2013 Exit[employer] Patient Information 2015 [employer]. This information is not intended to replace advice given to you by your health care provider. Make sure you discuss any questions you have with your health care provider.  

## 2014-04-11 NOTE — Progress Notes (Signed)
MRN: [id number] Name: Arthur Griffith  Sex: male Age: 33 y.o. DOB: 01-Feb-1981  Allergies: Review of patient's allergies indicates no known allergies.  Chief Complaint  Patient presents with  . Follow-up    HPI: Patient is 33 y.o. male who has to of diabetes hyperlipidemia, as per patient he sometimes he skips the dose of metformin, he denies any hypoglycemic symptoms but has been checking his blood sugar regularly, is also taking Amaryl 1 mg, as compared to last visit her A1c is slightly improved but still uncontrolled 12.0%, I have discussed with patient about the diabetes been uncontrolled and he probably needs insulin at this time, patient wants to work on his diet and continue with medications, I reviewed the blood work with the patient noticed elevated triglycerides., I have advised patient for low fat diet patient is already on statins, patient denies smoking cigarettes his blood pressure is controlled.  Past Medical History  Diagnosis Date  . Diabetes mellitus without complication     No past surgical history on file.    Medication List       This list is accurate as of: 04/11/14  6:03 PM.  Always use your most recent med list.               gemfibrozil 600 MG tablet  Commonly known as:  LOPID  Take 1 tablet (600 mg total) by mouth 2 (two) times daily before a meal.     glimepiride 2 MG tablet  Commonly known as:  AMARYL  Take 0.5 tablets (1 mg total) by mouth daily with breakfast.     metFORMIN 1000 MG tablet  Commonly known as:  GLUCOPHAGE  Take 0.5 tablets (500 mg total) by mouth 2 (two) times daily with a meal.     naproxen 500 MG tablet  Commonly known as:  NAPROSYN  Take 1 tablet (500 mg total) by mouth 2 (two) times daily with a meal.     pravastatin 40 MG tablet  Commonly known as:  PRAVACHOL  Take 1 tablet (40 mg total) by mouth daily.        Meds ordered this encounter  Medications  . glimepiride (AMARYL) 2 MG tablet    Sig: Take 0.5  tablets (1 mg total) by mouth daily with breakfast.    Dispense:  30 tablet    Refill:  2  . metFORMIN (GLUCOPHAGE) 1000 MG tablet    Sig: Take 0.5 tablets (500 mg total) by mouth 2 (two) times daily with a meal.    Dispense:  60 tablet    Refill:  3  . pravastatin (PRAVACHOL) 40 MG tablet    Sig: Take 1 tablet (40 mg total) by mouth daily.    Dispense:  30 tablet    Refill:  2  . naproxen (NAPROSYN) 500 MG tablet    Sig: Take 1 tablet (500 mg total) by mouth 2 (two) times daily with a meal.    Dispense:  30 tablet    Refill:  0  . gemfibrozil (LOPID) 600 MG tablet    Sig: Take 1 tablet (600 mg total) by mouth 2 (two) times daily before a meal.    Dispense:  60 tablet    Refill:  3     There is no immunization history on file for this patient.  Family History  Problem Relation Age of Onset  . Diabetes Mother   . Diabetes Father   . Diabetes Maternal Grandmother   . Diabetes  Maternal Grandfather   . Diabetes Paternal Grandmother   . Diabetes Paternal Grandfather     History  Substance Use Topics  . Smoking status: Never Smoker   . Smokeless tobacco: Not on file  . Alcohol Use: Yes    Review of Systems   As noted in HPI  Filed Vitals:   04/11/14 1731  BP: 121/80  Pulse: 98  Temp: 98.2 F (36.8 C)  Resp: 16    Physical Exam  Physical Exam  Constitutional: No distress.  Eyes: EOM are normal.  Cardiovascular: Normal rate and regular rhythm.   Pulmonary/Chest: Breath sounds normal. No respiratory distress. He has no wheezes. He has no rales.  Musculoskeletal: He exhibits no edema.    CBC    Component Value Date/Time   WBC 5.3 03/22/2014 0929   RBC 5.71 03/22/2014 0929   HGB 14.9 03/22/2014 0929   HCT 45.1 03/22/2014 0929   PLT 251 03/22/2014 0929   MCV 79.0 03/22/2014 0929   LYMPHSABS 2.1 12/21/2013 1014   MONOABS 0.3 12/21/2013 1014   EOSABS 0.1 12/21/2013 1014   BASOSABS 0.0 12/21/2013 1014    CMP     Component Value Date/Time   NA 136* 03/22/2014  0929   K 4.1 03/22/2014 0929   CL 99 03/22/2014 0929   CO2 23 03/22/2014 0929   GLUCOSE 289* 03/22/2014 0929   BUN 11 03/22/2014 0929   CREATININE 0.47* 03/22/2014 0929   CREATININE 0.64 12/21/2013 1014   CALCIUM 8.9 03/22/2014 0929   PROT 7.4 03/22/2014 0929   ALBUMIN 3.9 03/22/2014 0929   AST 32 03/22/2014 0929   ALT 42 03/22/2014 0929   ALKPHOS 95 03/22/2014 0929   BILITOT 0.6 03/22/2014 0929   GFRNONAA >90 03/22/2014 0929   GFRNONAA >89 12/21/2013 1014   GFRAA >90 03/22/2014 0929   GFRAA >89 12/21/2013 1014    Lab Results  Component Value Date/Time   CHOL 251* 12/21/2013 10:14 AM    No components found with this basename: hga1c    Lab Results  Component Value Date/Time   AST 32 03/22/2014  9:29 AM    Assessment and Plan  Diabetes mellitus due to underlying condition without complications - Plan: Results for orders placed in visit on 04/11/14  GLUCOSE, POCT (MANUAL RESULT ENTRY)      Result Value Ref Range   POC Glucose 347 (*) 70 - 99 mg/dl  POCT GLYCOSYLATED HEMOGLOBIN (HGB A1C)      Result Value Ref Range   Hemoglobin A1C 12.0     Reinforced patient with diet modification, his A1c is slightly improved and patient has not been taking medication regularly, I have increased the dose of metformin as well as Amaryl, advised to keep the fingerstick log, on the following visit if his A1c is not improved consider starting on insulin., glimepiride (AMARYL) 2 MG tablet, metFORMIN (GLUCOPHAGE) 1000 MG tablet  Other and unspecified hyperlipidemia - Plan: Continue with pravastatin (PRAVACHOL) 40 MG tablet  Hypertriglyceridemia - Plan: Advised for low-fat diet also started on gemfibrozil (LOPID) 600 MG tablet   Health Maintenance  -Vaccinations:   -Influenza shot given today   Return in about 3 months (around 07/11/2014) for diabetes, hyperipidemia.  Doris Cheadle, MD

## 2014-04-25 ENCOUNTER — Ambulatory Visit: Payer: No Typology Code available for payment source

## 2015-05-27 ENCOUNTER — Telehealth: Payer: Self-pay | Admitting: Pediatrics

## 2015-05-27 DIAGNOSIS — Z207 Contact with and (suspected) exposure to pediculosis, acariasis and other infestations: Secondary | ICD-10-CM

## 2015-05-27 DIAGNOSIS — Z2089 Contact with and (suspected) exposure to other communicable diseases: Secondary | ICD-10-CM

## 2015-05-27 MED ORDER — PERMETHRIN 5 % EX CREA: 1.0000 "application " | TOPICAL_CREAM | Freq: Once | CUTANEOUS | Status: DC

## 2015-05-27 NOTE — Telephone Encounter (Signed)
Step-daughters were seen in clinic today with scabies.  All household contacts are being treated.

## 2016-02-07 ENCOUNTER — Encounter (HOSPITAL_COMMUNITY): Payer: Self-pay | Admitting: Emergency Medicine

## 2016-02-07 ENCOUNTER — Emergency Department (HOSPITAL_COMMUNITY)
Admission: EM | Admit: 2016-02-07 | Discharge: 2016-02-08 | Disposition: A | Discharge status: Home / Self Care | Payer: Self-pay | Attending: Emergency Medicine | Admitting: Emergency Medicine

## 2016-02-07 ENCOUNTER — Emergency Department (HOSPITAL_COMMUNITY): Payer: Self-pay

## 2016-02-07 DIAGNOSIS — Z79899 Other long term (current) drug therapy: Secondary | ICD-10-CM | POA: Insufficient documentation

## 2016-02-07 DIAGNOSIS — Z5321 Procedure and treatment not carried out due to patient leaving prior to being seen by health care provider: Secondary | ICD-10-CM | POA: Insufficient documentation

## 2016-02-07 DIAGNOSIS — Z791 Long term (current) use of non-steroidal anti-inflammatories (NSAID): Secondary | ICD-10-CM | POA: Insufficient documentation

## 2016-02-07 DIAGNOSIS — E119 Type 2 diabetes mellitus without complications: Secondary | ICD-10-CM | POA: Insufficient documentation

## 2016-02-07 DIAGNOSIS — Z7984 Long term (current) use of oral hypoglycemic drugs: Secondary | ICD-10-CM | POA: Insufficient documentation

## 2016-02-07 LAB — I-STAT TROPONIN, ED: TROPONIN I, POC: 0 ng/mL (ref 0.00–0.08)

## 2016-02-07 LAB — BASIC METABOLIC PANEL
ANION GAP: 9 (ref 5–15)
BUN: 20 mg/dL (ref 6–20)
CALCIUM: 9.3 mg/dL (ref 8.9–10.3)
CO2: 23 mmol/L (ref 22–32)
Chloride: 101 mmol/L (ref 101–111)
Creatinine, Ser: 0.63 mg/dL (ref 0.61–1.24)
GFR calc Af Amer: >60 mL/min (ref >60)
GLUCOSE: 367 mg/dL — AB (ref 65–99)
POTASSIUM: 4 mmol/L (ref 3.5–5.1)
SODIUM: 133 mmol/L — AB (ref 135–145)

## 2016-02-07 LAB — CBC
HEMATOCRIT: 45 % (ref 39.0–52.0)
HEMOGLOBIN: 14.7 g/dL (ref 13.0–17.0)
MCH: 25.8 pg — ABNORMAL LOW (ref 26.0–34.0)
MCHC: 32.7 g/dL (ref 30.0–36.0)
MCV: 79.1 fL (ref 78.0–100.0)
Platelets: 280 10*3/uL (ref 150–400)
RBC: 5.69 MIL/uL (ref 4.22–5.81)
RDW: 13 % (ref 11.5–15.5)
WBC: 6.6 10*3/uL (ref 4.0–10.5)

## 2016-02-07 NOTE — ED Notes (Signed)
EKG given to Dr. Wentz 

## 2016-02-07 NOTE — ED Notes (Signed)
Pt reports left side chest pain w/o radiation x2-3days. States sometimes he gets SOB with it but not currently. No heart hx. A/O NAD.

## 2016-02-07 NOTE — ED Notes (Signed)
Patient transported to X-ray 

## 2021-11-19 ENCOUNTER — Emergency Department (HOSPITAL_COMMUNITY)
Admission: EM | Admit: 2021-11-19 | Discharge: 2021-11-19 | Disposition: A | Discharge status: Home / Self Care | Payer: Self-pay | Attending: Emergency Medicine | Admitting: Emergency Medicine

## 2021-11-19 ENCOUNTER — Encounter (HOSPITAL_COMMUNITY): Payer: Self-pay | Admitting: Emergency Medicine

## 2021-11-19 ENCOUNTER — Other Ambulatory Visit: Payer: Self-pay

## 2021-11-19 ENCOUNTER — Emergency Department (HOSPITAL_COMMUNITY): Payer: Self-pay

## 2021-11-19 DIAGNOSIS — R1013 Epigastric pain: Secondary | ICD-10-CM | POA: Insufficient documentation

## 2021-11-19 DIAGNOSIS — R079 Chest pain, unspecified: Secondary | ICD-10-CM | POA: Insufficient documentation

## 2021-11-19 LAB — CBC WITH DIFFERENTIAL/PLATELET
Abs Immature Granulocytes: 0.02 10*3/uL (ref 0.00–0.07)
Basophils Absolute: 0 10*3/uL (ref 0.0–0.1)
Basophils Relative: 1 %
Eosinophils Absolute: 0.1 10*3/uL (ref 0.0–0.5)
Eosinophils Relative: 2 %
HCT: 47.4 % (ref 39.0–52.0)
Hemoglobin: 15.1 g/dL (ref 13.0–17.0)
Immature Granulocytes: 0 %
Lymphocytes Relative: 40 %
Lymphs Abs: 2.1 10*3/uL (ref 0.7–4.0)
MCH: 25.8 pg — ABNORMAL LOW (ref 26.0–34.0)
MCHC: 31.9 g/dL (ref 30.0–36.0)
MCV: 80.9 fL (ref 80.0–100.0)
Monocytes Absolute: 0.5 10*3/uL (ref 0.1–1.0)
Monocytes Relative: 9 %
Neutro Abs: 2.5 10*3/uL (ref 1.7–7.7)
Neutrophils Relative %: 48 %
Platelets: 236 10*3/uL (ref 150–400)
RBC: 5.86 MIL/uL — ABNORMAL HIGH (ref 4.22–5.81)
RDW: 12.6 % (ref 11.5–15.5)
WBC: 5.2 10*3/uL (ref 4.0–10.5)
nRBC: 0 % (ref 0.0–0.2)

## 2021-11-19 LAB — COMPREHENSIVE METABOLIC PANEL
ALT: 39 U/L (ref 0–44)
AST: 26 U/L (ref 15–41)
Albumin: 3.7 g/dL (ref 3.5–5.0)
Alkaline Phosphatase: 71 U/L (ref 38–126)
Anion gap: 9 (ref 5–15)
BUN: 14 mg/dL (ref 6–20)
CO2: 26 mmol/L (ref 22–32)
Calcium: 9.1 mg/dL (ref 8.9–10.3)
Chloride: 100 mmol/L (ref 98–111)
Creatinine, Ser: 0.66 mg/dL (ref 0.61–1.24)
GFR, Estimated: >60 mL/min (ref >60)
Glucose, Bld: 328 mg/dL — ABNORMAL HIGH (ref 70–99)
Potassium: 4.2 mmol/L (ref 3.5–5.1)
Sodium: 135 mmol/L (ref 135–145)
Total Bilirubin: 0.7 mg/dL (ref 0.3–1.2)
Total Protein: 7 g/dL (ref 6.5–8.1)

## 2021-11-19 LAB — LIPASE, BLOOD: Lipase: 36 U/L (ref 11–51)

## 2021-11-19 LAB — TROPONIN I (HIGH SENSITIVITY)
Troponin I (High Sensitivity): 3 ng/L (ref <18)
Troponin I (High Sensitivity): 3 ng/L (ref <18)

## 2021-11-19 MED ORDER — OMEPRAZOLE 20 MG PO CPDR: 20.0000 mg | DELAYED_RELEASE_CAPSULE | Freq: Every day | ORAL | 0 refills | Status: AC

## 2021-11-19 MED ORDER — LIDOCAINE VISCOUS HCL 2 % MT SOLN
15.0000 mL | Freq: Once | OROMUCOSAL | Status: AC
  Administered 2021-11-19: 15 mL via ORAL
  Filled 2021-11-19: qty 15

## 2021-11-19 MED ORDER — ALUM & MAG HYDROXIDE-SIMETH 200-200-20 MG/5ML PO SUSP
30.0000 mL | Freq: Once | ORAL | Status: AC
  Administered 2021-11-19: 30 mL via ORAL
  Filled 2021-11-19: qty 30

## 2021-11-19 NOTE — Discharge Instructions (Signed)
Your test today were normal.  There was no signs of a heart attack today.  Take the medications as written. ? ?Call your primary care doctor or specialist as discussed in the next 2-3 days.   ?Return immediately back to the ER if: ? ?Your symptoms worsen within the next 12-24 hours. ?You develop new symptoms such as new fevers, persistent vomiting, new pain, shortness of breath, or new weakness or numbness, or if you have any other concerns. ? ?

## 2021-11-19 NOTE — ED Triage Notes (Signed)
Patient coming from home, complaint of chest pain x1 week, states pain has been constant. ?

## 2021-11-19 NOTE — ED Provider Notes (Signed)
?[facility] EMERGENCY DEPARTMENT ?Provider Note ? ? ?CSN: [id number] ?Arrival date & time: 11/19/21  1018 ? ?  ? ?History ? ?Chief Complaint  ?Patient presents with  ? Chest Pain  ? ? ?Arthur Griffith is a 41 y.o. male. ? ?Patient presents ER chief complaint of chest pain.  Describes it as a epigastric region which causes chest pain ongoing for the past week.  Nothing seems to make it better or worse.  No associated fevers or cough no vomiting or diarrhea no shortness of breath or palpitations.  Denies any prior cardiac history. ? ? ?  ? ?Home Medications ?Prior to Admission medications   ?Medication Sig Start Date End Date Taking? Authorizing Provider  ?metFORMIN (GLUCOPHAGE) 1000 MG tablet Take 0.5 tablets (500 mg total) by mouth 2 (two) times daily with a meal. ?Patient taking differently: Take 1,000 mg by mouth 2 (two) times daily with a meal. 04/11/14  Yes Advani, Deepak, MD  ?Multiple Vitamin (MULTIVITAMIN) tablet Take 1 tablet by mouth daily.   Yes [provider]  ?omeprazole (PRILOSEC) 20 MG capsule Take 1 capsule (20 mg total) by mouth daily. 11/19/21 12/19/21 Yes Cheryll Cockayne, MD  ?gemfibrozil (LOPID) 600 MG tablet Take 1 tablet (600 mg total) by mouth 2 (two) times daily before a meal. ?Patient not taking: Reported on 11/19/2021 04/11/14   Doris Cheadle, MD  ?glimepiride (AMARYL) 2 MG tablet Take 0.5 tablets (1 mg total) by mouth daily with breakfast. ?Patient not taking: Reported on 11/19/2021 04/11/14   Doris Cheadle, MD  ?pravastatin (PRAVACHOL) 40 MG tablet Take 1 tablet (40 mg total) by mouth daily. ?Patient not taking: Reported on 11/19/2021 04/11/14   Doris Cheadle, MD  ?   ? ?Allergies    ?Patient has no known allergies.   ? ?Review of Systems   ?Review of Systems  ?Constitutional:  Negative for fever.  ?HENT:  Negative for ear pain and sore throat.   ?Eyes:  Negative for pain.  ?Respiratory:  Negative for cough.   ?Cardiovascular:  Positive for chest pain.   ?Gastrointestinal:  Negative for abdominal pain.  ?Genitourinary:  Negative for flank pain.  ?Musculoskeletal:  Negative for back pain.  ?Skin:  Negative for color change and rash.  ?Neurological:  Negative for syncope.  ?All other systems reviewed and are negative. ? ?Physical Exam ?Updated Vital Signs ?BP 106/77   Pulse (!) 104   Temp 97.8 ?F (36.6 ?C)   Resp (!) 24   Ht 5\' 6"  (1.676 m)   Wt 79.8 kg   SpO2 96%   BMI 28.41 kg/m?  ?Physical Exam ?Constitutional:   ?   Appearance: He is well-developed.  ?HENT:  ?   Head: Normocephalic.  ?   Nose: Nose normal.  ?Eyes:  ?   Extraocular Movements: Extraocular movements intact.  ?Cardiovascular:  ?   Rate and Rhythm: Normal rate.  ?Pulmonary:  ?   Effort: Pulmonary effort is normal.  ?Abdominal:  ?   Comments: Mild tenderness in the epigastric region.  ?Skin: ?   Coloration: Skin is not jaundiced.  ?Neurological:  ?   Mental Status: He is alert. Mental status is at baseline.  ? ? ?ED Results / Procedures / Treatments   ?Labs ?(all labs ordered are listed, but only abnormal results are displayed) ?Labs Reviewed  ?CBC WITH DIFFERENTIAL/PLATELET - Abnormal; Notable for the following components:  ?    Result Value  ? RBC 5.86 (*)   ? MCH 25.8 (*)   ?  All other components within normal limits  ?COMPREHENSIVE METABOLIC PANEL - Abnormal; Notable for the following components:  ? Glucose, Bld 328 (*)   ? All other components within normal limits  ?LIPASE, BLOOD  ?TROPONIN I (HIGH SENSITIVITY)  ?TROPONIN I (HIGH SENSITIVITY)  ? ? ?EKG ?None ? ?Radiology ?DG Chest Port 1 View ? ?Result Date: 11/19/2021 ?CLINICAL DATA:  Chest pain EXAM: PORTABLE CHEST 1 VIEW COMPARISON:  02/07/2016 FINDINGS: The heart size and mediastinal contours are within normal limits. Minimal bandlike scarring of the left lung base. The visualized skeletal structures are unremarkable. IMPRESSION: No acute abnormality of the lungs in AP portable projection. Electronically Signed   By: Jearld Lesch M.D.    On: 11/19/2021 10:57   ? ?Procedures ?Procedures  ? ? ?Medications Ordered in ED ?Medications  ?alum & mag hydroxide-simeth (MAALOX/MYLANTA) 200-200-20 MG/5ML suspension 30 mL (30 mLs Oral Given 11/19/21 1059)  ?  And  ?lidocaine (XYLOCAINE) 2 % viscous mouth solution 15 mL (15 mLs Oral Given 11/19/21 1059)  ? ? ?ED Course/ Medical Decision Making/ A&P ?  ?                        ?Medical Decision Making ?Amount and/or Complexity of Data Reviewed ?Labs: ordered. ?Radiology: ordered. ? ?Risk ?OTC drugs. ?Prescription drug management. ? ? ?Cardiac monitoring showing sinus rhythm. ? ?Chart review shows outpatient office visit July 2020. ? ?Work-up includes labs CBC CMP.  Troponins are flat x2.  Patient given a GI cocktail with some improvement.  Chest x-ray is unremarkable EKG is unremarkable. ? ?Recommending outpatient follow-up with primary care doctor within the week.  Recommending immediate return for worsening pain or any additional concerns.  Given the history and presentation of his illness I doubt acute coronary syndrome.  Recommending continued close outpatient follow-up as mentioned. ? ? ? ? ? ? ? ?Final Clinical Impression(s) / ED Diagnoses ?Final diagnoses:  ?Epigastric pain  ? ? ?Rx / DC Orders ?ED Discharge Orders   ? ?      Ordered  ?  omeprazole (PRILOSEC) 20 MG capsule  Daily       ? 11/19/21 1514  ? ?  ?  ? ?  ? ? ?  ?Cheryll Cockayne, MD ?11/19/21 1514 ? ?

## 2023-12-05 IMAGING — DX DG CHEST 1V PORT
1 series · 1 of 1 positions shown · non-contrast
Comparison: 02/07/2016

CLINICAL DATA: Chest pain

EXAM:
PORTABLE CHEST 1 VIEW

[chest]
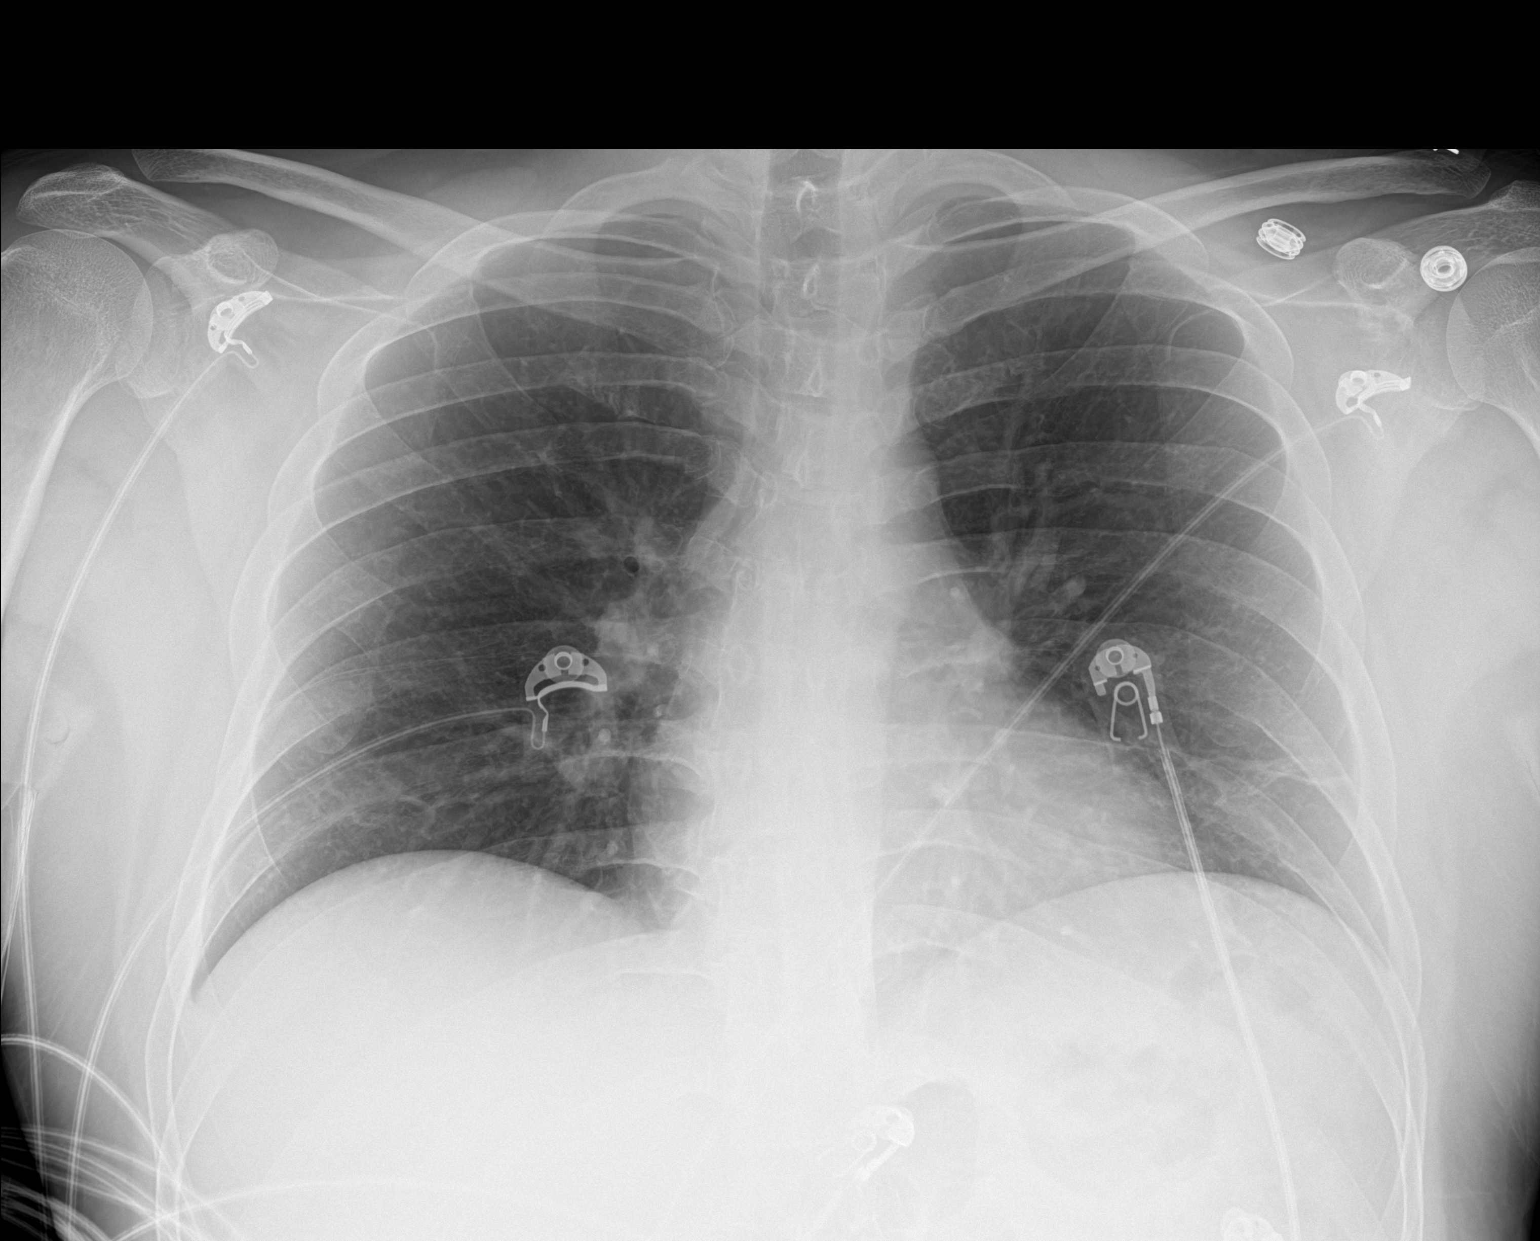

[1 of 1 positions shown; findings below may reference images not displayed]

FINDINGS: The heart size and mediastinal contours are within normal limits.
Minimal bandlike scarring of the left lung base. The visualized
skeletal structures are unremarkable.
IMPRESSION: No acute abnormality of the lungs in AP portable projection.
